# Patient Record
Sex: Male | Born: 1995 | Race: White | Hispanic: No | Marital: Single | Smoking: Current every day smoker
Health system: Southern US, Community
[De-identification: ages and names within clinical notes are randomized; demographics above are authoritative.]

## PROBLEM LIST (undated history)

## (undated) DIAGNOSIS — S32009A Unspecified fracture of unspecified lumbar vertebra, initial encounter for closed fracture: Secondary | ICD-10-CM

## (undated) DIAGNOSIS — B974 Respiratory syncytial virus as the cause of diseases classified elsewhere: Secondary | ICD-10-CM

## (undated) DIAGNOSIS — J45990 Exercise induced bronchospasm: Secondary | ICD-10-CM

## (undated) DIAGNOSIS — B338 Other specified viral diseases: Secondary | ICD-10-CM

## (undated) HISTORY — DX: Respiratory syncytial virus as the cause of diseases classified elsewhere: B97.4

## (undated) HISTORY — DX: Exercise induced bronchospasm: J45.990

## (undated) HISTORY — DX: Other specified viral diseases: B33.8

## (undated) HISTORY — DX: Unspecified fracture of unspecified lumbar vertebra, initial encounter for closed fracture: S32.009A

---
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
## 2015-02-22 ENCOUNTER — Telehealth: Payer: Self-pay | Admitting: Family Medicine

## 2015-02-22 NOTE — Telephone Encounter (Signed)
Spoke to patient's mom.  Calvin Garrett would like to re establish with Dr. Fabian SharpPanosh.  Please help the pt to make an appt.  Please send new patient paper work for him to fill out before his visit.  Thanks!

## 2015-02-25 NOTE — Telephone Encounter (Addendum)
Pt # is ringing busy

## 2015-02-28 NOTE — Telephone Encounter (Signed)
Misty the pt phone # is still ringing busy. Who is pt mother so I can try mom ?

## 2015-02-28 NOTE — Telephone Encounter (Signed)
Rayfield CitizenCaroline Armstroneg 02/04/1965

## 2015-03-01 NOTE — Telephone Encounter (Signed)
Called mom at [phone] unable to leave message vm not set up yet

## 2015-03-04 NOTE — Telephone Encounter (Signed)
Call pt at [phone] lmom for pt to cb

## 2015-03-05 NOTE — Telephone Encounter (Signed)
Pt has been sch

## 2015-04-02 ENCOUNTER — Encounter: Payer: Self-pay | Admitting: Internal Medicine

## 2015-04-02 ENCOUNTER — Telehealth: Payer: Self-pay | Admitting: Internal Medicine

## 2015-04-02 ENCOUNTER — Ambulatory Visit (INDEPENDENT_AMBULATORY_CARE_PROVIDER_SITE_OTHER): Payer: BLUE CROSS/BLUE SHIELD | Admitting: Internal Medicine

## 2015-04-02 VITALS — BP 130/74 | Temp 98.4°F | Ht 73.0 in | Wt 289.0 lb

## 2015-04-02 DIAGNOSIS — Z720 Tobacco use: Secondary | ICD-10-CM | POA: Diagnosis not present

## 2015-04-02 DIAGNOSIS — J45990 Exercise induced bronchospasm: Secondary | ICD-10-CM

## 2015-04-02 DIAGNOSIS — Z0000 Encounter for general adult medical examination without abnormal findings: Secondary | ICD-10-CM | POA: Diagnosis not present

## 2015-04-02 LAB — LIPID PANEL
Cholesterol: 118 mg/dL (ref 0–200)
HDL: 32.4 mg/dL — ABNORMAL LOW (ref >39.00)
LDL CALC: 64 mg/dL (ref 0–99)
NONHDL: 85.6
Total CHOL/HDL Ratio: 4
Triglycerides: 110 mg/dL (ref 0.0–149.0)
VLDL: 22 mg/dL (ref 0.0–40.0)

## 2015-04-02 LAB — BASIC METABOLIC PANEL
BUN: 11 mg/dL (ref 6–23)
CHLORIDE: 104 meq/L (ref 96–112)
CO2: 32 mEq/L (ref 19–32)
Calcium: 9.8 mg/dL (ref 8.4–10.5)
Creatinine, Ser: 0.98 mg/dL (ref 0.40–1.50)
GFR: 103.5 mL/min (ref >60.00)
GLUCOSE: 94 mg/dL (ref 70–99)
POTASSIUM: 4.5 meq/L (ref 3.5–5.1)
Sodium: 141 mEq/L (ref 135–145)

## 2015-04-02 LAB — CBC WITH DIFFERENTIAL/PLATELET
BASOS PCT: 0.4 % (ref 0.0–3.0)
Basophils Absolute: 0 10*3/uL (ref 0.0–0.1)
Eosinophils Absolute: 0.3 10*3/uL (ref 0.0–0.7)
Eosinophils Relative: 3.7 % (ref 0.0–5.0)
HCT: 45.9 % (ref 39.0–52.0)
HEMOGLOBIN: 15.8 g/dL (ref 13.0–17.0)
LYMPHS PCT: 36.6 % (ref 12.0–46.0)
Lymphs Abs: 2.8 10*3/uL (ref 0.7–4.0)
MCHC: 34.4 g/dL (ref 30.0–36.0)
MCV: 81.5 fl (ref 78.0–100.0)
MONO ABS: 0.6 10*3/uL (ref 0.1–1.0)
Monocytes Relative: 7.6 % (ref 3.0–12.0)
NEUTROS ABS: 3.9 10*3/uL (ref 1.4–7.7)
Neutrophils Relative %: 51.7 % (ref 43.0–77.0)
Platelets: 226 10*3/uL (ref 150.0–400.0)
RBC: 5.64 Mil/uL (ref 4.22–5.81)
RDW: 13.6 % (ref 11.5–14.6)
WBC: 7.5 10*3/uL (ref 4.5–10.5)

## 2015-04-02 LAB — HEPATIC FUNCTION PANEL
ALBUMIN: 4.5 g/dL (ref 3.5–5.2)
ALT: 18 U/L (ref 0–53)
AST: 25 U/L (ref 0–37)
Alkaline Phosphatase: 50 U/L (ref 39–117)
BILIRUBIN TOTAL: 0.7 mg/dL (ref 0.2–1.2)
Bilirubin, Direct: 0.2 mg/dL (ref 0.0–0.3)
Total Protein: 7.3 g/dL (ref 6.0–8.3)

## 2015-04-02 LAB — TSH: TSH: 1.21 u[IU]/mL (ref 0.35–5.50)

## 2015-04-02 NOTE — Patient Instructions (Signed)
Will notify you  of labs when available. STOP TOBACCO  .  This can damage lungs over time .   Can cause  increase  .... asthma .   Let us know how we can help.   Healthy lifestyle includes : At least 150 minutes of exercise weeks  , weight at healthy levels, which is usually   BMI 19-25. Avoid trans fats and processed foods;  Increase fresh fruits and veges to 5 servings per day. And avoid sweet beverages including tea and juice. Mediterranean diet with olive oil and nuts have been noted to be heart and brain healthy . Avoid tobacco products . Limit  alcohol to  7 per week for women and 14 servings for men.  Get adequate sleep . Wear seat belts . Don't text and drive .   Nicotine Addiction Nicotine can act as both a stimulant (excites/activates) and a sedative (calms/quiets). Immediately after exposure to nicotine, there is a "kick" caused in part by the drug's stimulation of the adrenal glands and resulting discharge of adrenaline (epinephrine). The rush of adrenaline stimulates the body and causes a sudden release of sugar. This means that smokers are always slightly hyperglycemic. Hyperglycemic means that the blood sugar is high, just like in diabetics. Nicotine also decreases the amount of insulin which helps control sugar levels in the body. There is an increase in blood pressure, breathing, and the rate of heart beats.  In addition, nicotine indirectly causes a release of dopamine in the brain that controls pleasure and motivation. A similar reaction is seen with other drugs of abuse, such as cocaine and heroin. This dopamine release is thought to cause the pleasurable sensations when smoking. In some different cases, nicotine can also create a calming effect, depending on sensitivity of the smoker's nervous system and the dose of nicotine taken. WHAT HAPPENS WHEN NICOTINE IS TAKEN FOR LONG PERIODS OF TIME?  Long-term use of nicotine results in addiction. It is difficult to  stop.  Repeated use of nicotine creates tolerance. Higher doses of nicotine are needed to get the "kick." When nicotine use is stopped, withdrawal may last a month or more. Withdrawal may begin within a few hours after the last cigarette. Symptoms peak within the first few days and may lessen within a few weeks. For some people, however, symptoms may last for months or longer. Withdrawal symptoms include:   Irritability.  Craving.  Learning and attention deficits.  Sleep disturbances.  Increased appetite. Craving for tobacco may last for 6 months or longer. Many behaviors done while using nicotine can also play a part in the severity of withdrawal symptoms. For some people, the feel, smell, and sight of a cigarette and the ritual of obtaining, handling, lighting, and smoking the cigarette are closely linked with the pleasure of smoking. When stopped, they also miss the related behaviors which make the withdrawal or craving worse. While nicotine gum and patches may lessen the drug aspects of withdrawal, cravings often persist. WHAT ARE THE MEDICAL CONSEQUENCES OF NICOTINE USE?  Nicotine addiction accounts for one-third of all cancers. The top cancer caused by tobacco is lung cancer. Lung cancer is the number one cancer killer of both men and women.  Smoking is also associated with cancers of the:  Mouth.  Pharynx.  Larynx.  Esophagus.  Stomach.  Pancreas.  Cervix.  Kidney.  Ureter.  Bladder.  Smoking also causes lung diseases such as lasting (chronic) bronchitis and emphysema.  It worsens asthma in adults and children.  Smoking increases the risk of heart disease, including:  Stroke.  Heart attack.  Vascular disease.  Aneurysm.  Passive or secondary smoke can also increase medical risks including:  Asthma in children.  Sudden Infant Death Syndrome (SIDS).  Additionally, dropped cigarettes are the leading cause of residential fire fatalities.  Nicotine  poisoning has been reported from accidental ingestion of tobacco products by children and pets. Death usually results in a few minutes from respiratory failure (when a person stops breathing) caused by paralysis. TREATMENT   Medication. Nicotine replacement medicines such as nicotine gum and the patch are used to stop smoking. These medicines gradually lower the dosage of nicotine in the body. These medicines do not contain the carbon monoxide and other toxins found in tobacco smoke.  Hypnotherapy.  Relaxation therapy.  Nicotine Anonymous (a 12-step support program). Find times and locations in your local yellow pages. Document Released: 08/19/2004 Document Revised: 03/07/2012 Document Reviewed: 02/09/2014 [facility] Patient Information 2015 [place]. This information is not intended to replace advice given to you by your health care provider. Make sure you discuss any questions you have with your health care provider.

## 2015-04-02 NOTE — Telephone Encounter (Signed)
emmi emailed °

## 2015-04-02 NOTE — Progress Notes (Signed)
Pre visit review using our clinic review tool, if applicable. No additional management support is needed unless otherwise documented below in the visit note.   Chief Complaint  Patient presents with  . 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 comes in today for  Reestablish  Care visit  And has form to begin as Sports coach  . He is generally well but has had inermittent asthma and rare use of inhaler ( 1 per year)  current smokes 1/2ppd since age  Or so . Marland Kitchen No cp sob . 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  No physical limitations  ocass  Back pain when standing around.  But no limiting  Remote football injury 2011 cracked vertebra and  fx ring finger  Ok now AStma sx are  shortness of breath and used  Inhaler  obtained from  urgent care last summer.   May be due for td in 2018 Had varicella .disease  Health Maintenance  Topic Date Due  . HIV Screening  02/11/2010  . TETANUS/TDAP  02/11/2014  . INFLUENZA VACCINE  07/29/2015   Health Maintenance Review LIFESTYLE:  Exercise:  Active som exercise  Tobacco/ETS:1/2ppd Alcohol:  ocass Sugar beverages:not much Sleep:7 hours  Drug use: no No tatoos  Uses condoms    ROS:  GEN/ HEENT: No fever, significant weight changes sweats headaches vision problems hearing changes, CV/ PULM; No chest pain shortness of breath cough, syncope,edema  change in exercise tolerance. GI /GU: No adominal pain, vomiting, change in bowel habits. No blood in the stool. No significant GU symptoms. SKIN/HEME: ,no acute skin rashes suspicious lesions or bleeding. No lymphadenopathy, nodules, masses.  NEURO/ PSYCH:  No neurologic signs such as weakness numbness. No depression anxiety. IMM/ Allergy: No unusual infections.  Allergy .   REST of 12 system review negative except as per HPI   Past Medical History  Diagnosis Date  . Exercise-induced asthma   . RSV infection    hospitalized  1996  . Fracture lumbar vertebra-closed 2011    football     No past surgical history on file.  Family History  Problem Relation Age of Onset  . Hypertension Mother   . Hypertension Paternal Grandfather   . Glaucoma Paternal Grandfather   . Skin cancer Paternal Grandfather   . Irritable bowel syndrome Paternal Grandmother   . Irritable bowel syndrome Paternal Aunt     History   Social History  . Marital Status: Single    Spouse Name: N/A  . Number of Children: N/A  . Years of Education: N/A   Social History Main Topics  . Smoking status: Current Every Day Smoker  . Smokeless tobacco: Never Used  . Alcohol Use: 0.0 oz/week    0 Standard drinks or equivalent per week     Comment: Will sometimes have a beer  . Drug Use: Not on file  . Sexual Activity: Not on file   Other Topics Concern  . None   Social History Narrative   7 hours of sleep per night   HS graduate 2015   Lives with his mother, her boyfriend and his sister   Works as a Systems developer full time   Wants to volunteer for the fire department    No outpatient encounter prescriptions on file as of 04/02/2015.    EXAM:  BP 130/74 mmHg  Temp(Src) 98.4 F (36.9  C) (Oral)  Ht 6\' 1"  (1.854 m)  Wt 289 lb (131.09 kg)  BMI 38.14 kg/m2  Body mass index is 38.14 kg/(m^2).  Physical Exam: Vital signs reviewed WUJ:[id number]:This is a well-developed well-nourished alert cooperative    who appearsr stated age in no acute distress.  HEENT: normocephalic atraumatic , Eyes: PERRL EOM's full, conjunctiva clear, Nares: paten,t no deformity discharge or tenderness., Ears: no deformity EAC's clear TMs with normal landmarks. Mouth: clear OP, no lesions, edema.  Moist mucous membranes. Dentition in adequate repair. NECK: supple without masses, thyromegaly or bruits. CHEST/PULM:  Clear to auscultation and percussion breath sounds equal no wheeze , rales or rhonchi. No chest wall deformities or tenderness. CV: PMI is  nondisplaced, S1 S2 no gallops, murmurs, rubs. Peripheral pulses are full without delay.No JVD .  ABDOMEN: Bowel sounds normal nontender  No guard or rebound, no hepato splenomegal no CVA tenderness.  Declined gu exam Extremtities:  No clubbing cyanosis or edema, no acute joint swelling or redness no focal atrophy NEURO:  Oriented x3, cranial nerves 3-12 appear to be intact, no obvious focal weakness,gait within normal limits no abnormal reflexes or asymmetrical SKIN: No acute rashes normal turgor, color, no bruising or petechiae. No major scars or tatoos  PSYCH: Oriented, good eye contact, no obvious depression anxiety, cognition and judgment appear normal. LN: no cervical axillary iadenopathy  ASSESSMENT AND PLAN:  Discussed the following assessment and plan:  Visit for preventive health examination - counseled  - Plan: Basic metabolic panel, CBC with Differential/Platelet, Hepatic function panel, Lipid panel, TSH, HIV antibody  Mild exercise-induced asthma - rare need for inhaler  by hx  Probably utd for imuniz sids tobacco cessation  EIA and lung health healthy eating etc  Decline sti testing  Form completed   No restrictions  Lab pending non fasting  Patient Care Team: Madelin HeadingsWanda K Jaymes Hang, MD as PCP - General Patient Instructions   Will notify you  of labs when available. STOP TOBACCO  .  This can damage lungs over time .   Can cause  increase  .... asthma .   Let us know how we can help.   Healthy lifestyle includes : At least 150 minutes of exercise weeks  , weight at healthy levels, which is usually   BMI 19-25. Avoid trans fats and processed foods;  Increase fresh fruits and veges to 5 servings per day. And avoid sweet beverages including tea and juice. Mediterranean diet with olive oil and nuts have been noted to be heart and brain healthy . Avoid tobacco products . Limit  alcohol to  7 per week for women and 14 servings for men.  Get adequate sleep . Wear seat belts . Don't  text and drive .   Nicotine Addiction Nicotine can act as both a stimulant (excites/activates) and a sedative (calms/quiets). Immediately after exposure to nicotine, there is a "kick" caused in part by the drug's stimulation of the adrenal glands and resulting discharge of adrenaline (epinephrine). The rush of adrenaline stimulates the body and causes a sudden release of sugar. This means that smokers are always slightly hyperglycemic. Hyperglycemic means that the blood sugar is high, just like in diabetics. Nicotine also decreases the amount of insulin which helps control sugar levels in the body. There is an increase in blood pressure, breathing, and the rate of heart beats.  In addition, nicotine indirectly causes a release of dopamine in the brain that controls pleasure and motivation. A similar reaction is seen with  other drugs of abuse, such as cocaine and heroin. This dopamine release is thought to cause the pleasurable sensations when smoking. In some different cases, nicotine can also create a calming effect, depending on sensitivity of the smoker's nervous system and the dose of nicotine taken. WHAT HAPPENS WHEN NICOTINE IS TAKEN FOR LONG PERIODS OF TIME?  Long-term use of nicotine results in addiction. It is difficult to stop.  Repeated use of nicotine creates tolerance. Higher doses of nicotine are needed to get the "kick." When nicotine use is stopped, withdrawal may last a month or more. Withdrawal may begin within a few hours after the last cigarette. Symptoms peak within the first few days and may lessen within a few weeks. For some people, however, symptoms may last for months or longer. Withdrawal symptoms include:   Irritability.  Craving.  Learning and attention deficits.  Sleep disturbances.  Increased appetite. Craving for tobacco may last for 6 months or longer. Many behaviors done while using nicotine can also play a part in the severity of withdrawal symptoms. For some  people, the feel, smell, and sight of a cigarette and the ritual of obtaining, handling, lighting, and smoking the cigarette are closely linked with the pleasure of smoking. When stopped, they also miss the related behaviors which make the withdrawal or craving worse. While nicotine gum and patches may lessen the drug aspects of withdrawal, cravings often persist. WHAT ARE THE MEDICAL CONSEQUENCES OF NICOTINE USE?  Nicotine addiction accounts for one-third of all cancers. The top cancer caused by tobacco is lung cancer. Lung cancer is the number one cancer killer of both men and women.  Smoking is also associated with cancers of the:  Mouth.  Pharynx.  Larynx.  Esophagus.  Stomach.  Pancreas.  Cervix.  Kidney.  Ureter.  Bladder.  Smoking also causes lung diseases such as lasting (chronic) bronchitis and emphysema.  It worsens asthma in adults and children.  Smoking increases the risk of heart disease, including:  Stroke.  Heart attack.  Vascular disease.  Aneurysm.  Passive or secondary smoke can also increase medical risks including:  Asthma in children.  Sudden Infant Death Syndrome (SIDS).  Additionally, dropped cigarettes are the leading cause of residential fire fatalities.  Nicotine poisoning has been reported from accidental ingestion of tobacco products by children and pets. Death usually results in a few minutes from respiratory failure (when a person stops breathing) caused by paralysis. TREATMENT   Medication. Nicotine replacement medicines such as nicotine gum and the patch are used to stop smoking. These medicines gradually lower the dosage of nicotine in the body. These medicines do not contain the carbon monoxide and other toxins found in tobacco smoke.  Hypnotherapy.  Relaxation therapy.  Nicotine Anonymous (a 12-step support program). Find times and locations in your local yellow pages. Document Released: 08/19/2004 Document Revised:  03/07/2012 Document Reviewed: 02/09/2014 [facility] Patient Information 2015 [place], [place]. This information is not intended to replace advice given to you by your health care provider. Make sure you discuss any questions you have with your health care provider.      Neta Mends. Raneshia Derick M.D.  Lab Results  Component Value Date   WBC 7.5 04/02/2015   HGB 15.8 04/02/2015   HCT 45.9 04/02/2015   PLT 226.0 04/02/2015   GLUCOSE 94 04/02/2015   CHOL 118 04/02/2015   TRIG 110.0 04/02/2015   HDL 32.40* 04/02/2015   LDLCALC 64 04/02/2015   ALT 18 04/02/2015   AST 25 04/02/2015  NA 141 04/02/2015   K 4.5 04/02/2015   CL 104 04/02/2015   CREATININE 0.98 04/02/2015   BUN 11 04/02/2015   CO2 32 04/02/2015   TSH 1.21 04/02/2015

## 2015-04-03 LAB — HIV ANTIBODY (ROUTINE TESTING W REFLEX): HIV 1&2 Ab, 4th Generation: NONREACTIVE

## 2015-07-02 ENCOUNTER — Telehealth: Payer: Self-pay | Admitting: *Deleted

## 2015-07-02 NOTE — Telephone Encounter (Signed)
Please get   [place] med record of visit at urgent care . Triage how he is doing . If stable we may be able to rx 1 inhaler     Would need ov before other refills,

## 2015-07-02 NOTE — Telephone Encounter (Signed)
[facility] Ventolin HFA 90mcg Inhaler, 2 puffs every 4 hours as needed. Not on pt's medication list, fax says was prescribed by urgent care originally.

## 2015-07-03 ENCOUNTER — Telehealth: Payer: Self-pay | Admitting: Family Medicine

## 2015-07-03 NOTE — Telephone Encounter (Signed)
Duplicate Request.

## 2015-07-03 NOTE — Telephone Encounter (Signed)
Refill request for Ventolin HFA inhaler, Dr. Jerilynn BirkenheadLijoi at [place], [facility] or call pt.

## 2015-07-03 NOTE — Telephone Encounter (Signed)
LM on cell for Joselyn Glassmanyler to return my call.

## 2015-07-03 NOTE — Telephone Encounter (Signed)
Left a message for the pt to return my call (pt did return my call earlier).

## 2015-07-04 ENCOUNTER — Telehealth: Payer: Self-pay | Admitting: *Deleted

## 2015-07-04 MED ORDER — ALBUTEROL SULFATE HFA 108 (90 BASE) MCG/ACT IN AERS: 2.0000 | INHALATION_SPRAY | Freq: Four times a day (QID) | RESPIRATORY_TRACT | Status: AC | PRN

## 2015-07-04 NOTE — Telephone Encounter (Signed)
Medication sent to the pharamcy.  Pt notified he will need to be seen for a follow up before any refills.  [facility]  Will see if medical records can obtain that report.  Believe phone number to be Phone: [phone].  Pt unsure.

## 2015-07-04 NOTE — Telephone Encounter (Signed)
Patient is requesting a refill of ventolin 90 mcg/inh #18.  Inhale 2 puffs every 4 hours as needed.  Originally prescribed by Dr Jerilynn BirkenheadLijoi at urgent care [place]. [facility].

## 2015-07-04 NOTE — Telephone Encounter (Signed)
Duplicate request

## 2015-07-04 NOTE — Addendum Note (Signed)
Addended by: Raj JanusADKINS, MISTY T on: 07/04/2015 02:53 PM   Modules accepted: Orders

## 2015-07-18 NOTE — Telephone Encounter (Signed)
Noted.  Will have the pt sign authorization at next appt.

## 2015-07-18 NOTE — Telephone Encounter (Signed)
Calvin Garrett  Urgent Care need a sign authorization from pt before they can send records.
